# Patient Record
Sex: Male | Born: 1970 | Race: White | Hispanic: No | Marital: Single | Smoking: Current every day smoker
Health system: Southern US, Community
[De-identification: ages and names within clinical notes are randomized; demographics above are authoritative.]

## PROBLEM LIST (undated history)

## (undated) DIAGNOSIS — E512 Wernicke's encephalopathy: Secondary | ICD-10-CM

## (undated) DIAGNOSIS — M1990 Unspecified osteoarthritis, unspecified site: Secondary | ICD-10-CM

## (undated) DIAGNOSIS — R569 Unspecified convulsions: Secondary | ICD-10-CM

## (undated) DIAGNOSIS — G3184 Mild cognitive impairment, so stated: Secondary | ICD-10-CM

## (undated) DIAGNOSIS — F1010 Alcohol abuse, uncomplicated: Secondary | ICD-10-CM

## (undated) HISTORY — DX: Alcohol abuse, uncomplicated: F10.10

## (undated) HISTORY — DX: Unspecified convulsions: R56.9

## (undated) HISTORY — DX: Unspecified osteoarthritis, unspecified site: M19.90

## (undated) HISTORY — DX: Wernicke's encephalopathy: E51.2

## (undated) HISTORY — DX: Mild cognitive impairment of uncertain or unknown etiology: G31.84

---

## 2016-11-17 ENCOUNTER — Emergency Department: Payer: Medicaid Other

## 2016-11-17 ENCOUNTER — Emergency Department
Admission: EM | Admit: 2016-11-17 | Discharge: 2016-11-18 | Disposition: A | Discharge status: Home / Self Care | Payer: Medicaid Other | Attending: Emergency Medicine | Admitting: Emergency Medicine

## 2016-11-17 DIAGNOSIS — M25561 Pain in right knee: Secondary | ICD-10-CM | POA: Diagnosis present

## 2016-11-17 DIAGNOSIS — R000 Tachycardia, unspecified: Secondary | ICD-10-CM | POA: Diagnosis not present

## 2016-11-17 DIAGNOSIS — R002 Palpitations: Secondary | ICD-10-CM

## 2016-11-17 LAB — CBC WITH DIFFERENTIAL/PLATELET
Basophils Absolute: 0.1 10*3/uL (ref 0–0.1)
Basophils Relative: 1 %
Eosinophils Absolute: 0 10*3/uL (ref 0–0.7)
Eosinophils Relative: 1 %
HCT: 43.2 % (ref 40.0–52.0)
Hemoglobin: 15.2 g/dL (ref 13.0–18.0)
Lymphocytes Relative: 17 %
Lymphs Abs: 1.3 10*3/uL (ref 1.0–3.6)
MCH: 31.7 pg (ref 26.0–34.0)
MCHC: 35.1 g/dL (ref 32.0–36.0)
MCV: 90.2 fL (ref 80.0–100.0)
Monocytes Absolute: 0.9 10*3/uL (ref 0.2–1.0)
Monocytes Relative: 11 %
Neutro Abs: 5.8 10*3/uL (ref 1.4–6.5)
Neutrophils Relative %: 72 %
Platelets: 329 10*3/uL (ref 150–440)
RBC: 4.79 MIL/uL (ref 4.40–5.90)
RDW: 14.1 % (ref 11.5–14.5)
WBC: 8.1 10*3/uL (ref 3.8–10.6)

## 2016-11-17 LAB — COMPREHENSIVE METABOLIC PANEL
ALT: 16 U/L — ABNORMAL LOW (ref 17–63)
AST: 20 U/L (ref 15–41)
Albumin: 3.9 g/dL (ref 3.5–5.0)
Alkaline Phosphatase: 87 U/L (ref 38–126)
Anion gap: 9 (ref 5–15)
BUN: 13 mg/dL (ref 6–20)
CO2: 26 mmol/L (ref 22–32)
Calcium: 9.3 mg/dL (ref 8.9–10.3)
Chloride: 105 mmol/L (ref 101–111)
Creatinine, Ser: 1.09 mg/dL (ref 0.61–1.24)
GFR calc Af Amer: >60 mL/min (ref >60)
GFR calc non Af Amer: >60 mL/min (ref >60)
Glucose, Bld: 90 mg/dL (ref 65–99)
Potassium: 3.6 mmol/L (ref 3.5–5.1)
Sodium: 140 mmol/L (ref 135–145)
Total Bilirubin: 0.5 mg/dL (ref 0.3–1.2)
Total Protein: 7 g/dL (ref 6.5–8.1)

## 2016-11-17 LAB — TROPONIN I: Troponin I: <0.03 ng/mL (ref <0.03)

## 2016-11-17 NOTE — ED Provider Notes (Signed)
[facility] Emergency Department Provider Note  ____________________________________________  Time seen: Approximately 10:56 PM  I have reviewed the triage vital signs and the nursing notes.   HISTORY  Chief Complaint Knee Pain    HPI Anthony Romero is a 46 y.o. male presenting to the emergency department via EMS with the chief complaint of 1 out of 10 right knee pain. Additionally, patient states that he is very concerned about tachycardia that he has recorded daily for the past week. Patient reports that he typically has sinus bradycardia at baseline. Patient reports that his resting heart rate has been 120 throughout the week and he has associated chest tightness. Patient denies chest pain, pain in the upper extremities, nausea, vomiting, abdominal pain or excessive diaphoresis. Patient states that he walks with a cane and has an undiagnosed "neurological condition". No alleviating measures have been attempted. Patient denies cough, congestion, excessive exertion and fever.   No past medical history on file.  There are no active problems to display for this patient.   No past surgical history on file.  Prior to Admission medications   Not on File    Allergies Keflex [cephalexin]  No family history on file.  Social History Social History  Substance Use Topics  . Smoking status: Not on file  . Smokeless tobacco: Not on file  . Alcohol use Not on file     Review of Systems  Constitutional: No fever/chills Eyes: No visual changes. No discharge ENT: No upper respiratory complaints. Cardiovascular: Patient has chest tightness. Respiratory: no cough. No SOB. Gastrointestinal: No abdominal pain.  No nausea, no vomiting.  No diarrhea.  No constipation. Genitourinary: Negative for dysuria. No hematuria Musculoskeletal: Patient has right knee pain.  Skin: Negative for rash, abrasions, lacerations, ecchymosis. Neurological: Negative for headaches,  focal weakness or numbness.   ____________________________________________   PHYSICAL EXAM:  VITAL SIGNS: ED Triage Vitals  Enc Vitals Group     BP 11/17/16 2124 120/73     Pulse Rate 11/17/16 2124 (!) 106     Resp 11/17/16 2124 18     Temp 11/17/16 2124 98.5 F (36.9 C)     Temp Source 11/17/16 2124 Oral     SpO2 11/17/16 2124 98 %     Weight --      Height --      Head Circumference --      Peak Flow --      Pain Score 11/17/16 2127 1     Pain Loc --      Pain Edu? --      Excl. in GC? --      Constitutional: Alert and oriented. Well appearing and in no acute distress. Eyes: Conjunctivae are normal. PERRL. EOMI. Head: Atraumatic. ENT:      Nose: No congestion/rhinnorhea.      Mouth/Throat: Mucous membranes are moist.  Neck: No stridor. No cervical spine tenderness to palpation. Cardiovascular: Normal rate, regular rhythm. Normal S1 and S2.  Good peripheral circulation. Respiratory: Normal respiratory effort without tachypnea or retractions. Lungs CTAB. Good air entry to the bases with no decreased or absent breath sounds. Gastrointestinal: Bowel sounds 4 quadrants. Soft and nontender to palpation. No guarding or rigidity. No palpable masses. No distention. No CVA tenderness. Musculoskeletal: Patient has 5/5 strength in the lower extremities bilaterally. Right knee: Peripatellar dimpling visualized. Negative anterior and posterior drawer test. No laxity with MCL or LCL testing. Negative ballottement. Patient has pain with palpation over the pes anserene bursa. Palpable  dorsalis pedis pulse bilaterally and symmetrically Neurologic:  Normal speech and language. No gross focal neurologic deficits are appreciated.  Skin:  Skin is warm, dry and intact. No rash noted. Psychiatric: Mood and affect are normal. Speech and behavior are normal. Patient exhibits appropriate insight and judgement.   ____________________________________________   LABS (all labs ordered are listed,  but only abnormal results are displayed)  Labs Reviewed  TROPONIN I  CBC WITH DIFFERENTIAL/PLATELET  COMPREHENSIVE METABOLIC PANEL   ____________________________________________  EKG   ____________________________________________  RADIOLOGY  Geraldo Pitter, personally viewed and evaluated these images (plain radiographs) as part of my medical decision making, as well as reviewing the written report by the radiologist.  Dg Knee Complete 4 Views Right  Result Date: 11/17/2016 CLINICAL DATA:  Right knee pain for several days EXAM: RIGHT KNEE - COMPLETE 4+ VIEW COMPARISON:  None. FINDINGS: Minimal patellofemoral degenerative change. No large effusion. No acute displaced fracture or malalignment. The joint space compartments are maintained IMPRESSION: No acute osseous abnormality. Electronically Signed   By: Jasmine Pang M.D.   On: 11/17/2016 22:15    ____________________________________________    PROCEDURES  Procedure(s) performed:    Procedures    Medications - No data to display   ____________________________________________   INITIAL IMPRESSION / ASSESSMENT AND PLAN / ED COURSE  Pertinent labs & imaging results that were available during my care of the patient were reviewed by me and considered in my medical decision making (see chart for details).  Review of the [place] CSRS was performed in accordance of the [facility] prior to dispensing any controlled drugs.     Assessment and plan: Chest tightness: Tachycardia: Right knee pain: Patient reports right knee pain. History and physical exam findings are consistent with pes anserine bursitis. Patient will be discharged with Mobic to be used daily for pain and inflammation. Patient also reports tachycardia and chest tightness. Patient was moved to main side of the emergency department for further care and management. Patient's case was discussed with Dr. Lenard Lance, who assumed patient care. All patient questions were  answered.   ____________________________________________  FINAL CLINICAL IMPRESSION(S) / ED DIAGNOSES  Final diagnoses:  Acute pain of right knee      NEW MEDICATIONS STARTED DURING THIS VISIT:  New Prescriptions   No medications on file        This chart was dictated using voice recognition software/Dragon. Despite best efforts to proofread, errors can occur which can change the meaning. Any change was purely unintentional.    Gasper Lloyd 11/17/16 2319    Sharman Cheek, MD 11/18/16 0120

## 2016-11-17 NOTE — ED Notes (Signed)
Patient to stat desk by EMS via wheelchair for right knee pain for "couple days".  Denies any type of injury.

## 2016-11-17 NOTE — ED Triage Notes (Signed)
Patient reports right knee pain for several days, denies any type of injury.  Reports feels like it is going to give way.

## 2016-11-17 NOTE — ED Provider Notes (Signed)
EKG interpreted by me Normal sinus rhythm rate of 76, normal axis and intervals. Normal QRS and ST segments. Isolated T-wave inversion in lead 3 which is nonspecific   Sharman CheekStafford, Libra Gatz, MD 11/17/16 2314

## 2016-11-17 NOTE — ED Notes (Signed)
Patient c/o tachycardia and chest tightness X 4 days. Pt reports associated symptoms of dizziness, lightheadedness, nausea, weakness and SOB. Pt denies diaphoresis. Pt reports he has chronic back issues, unsure if pain is different from his norm.

## 2016-11-18 LAB — T4, FREE: Free T4: 0.93 ng/dL (ref 0.61–1.12)

## 2016-11-18 LAB — TROPONIN I: Troponin I: <0.03 ng/mL (ref <0.03)

## 2016-11-18 LAB — TSH: TSH: 1.895 u[IU]/mL (ref 0.350–4.500)

## 2016-11-18 LAB — FIBRIN DERIVATIVES D-DIMER (ARMC ONLY): Fibrin derivatives D-dimer (ARMC): 279.44 (ref 0.00–499.00)

## 2016-11-18 MED ORDER — ASPIRIN 81 MG PO CHEW
324.0000 mg | CHEWABLE_TABLET | Freq: Once | ORAL | Status: AC
  Administered 2016-11-18: 324 mg via ORAL
  Filled 2016-11-18: qty 4

## 2016-11-18 MED ORDER — SODIUM CHLORIDE 0.9 % IV BOLUS (SEPSIS)
1000.0000 mL | Freq: Once | INTRAVENOUS | Status: AC
  Administered 2016-11-18: 1000 mL via INTRAVENOUS

## 2016-11-18 NOTE — ED Provider Notes (Addendum)
Medical screening examination/treatment/procedure(s) were conducted as a shared visit with non-physician practitioner(s) and myself.  I personally evaluated the patient during the encounter.   46 year old male with no significant past medical history who presented with complaints of knee pain for 2 days and also palpitations. Patient reports 6 days of heart rate in the 120s associated with mild chest discomfort. The symptoms have been constant over the course of the last 6 days. He denies URI symptoms, fever. He is not a smoker. No personal or family history of ischemic heart disease or blood clots, no recent travel or immobilization, no leg pain or swelling, no hemoptysis. Patient denies any history of thyroid disease. Denies any syncopal episodes. Patient also endorses 2 days of right knee pain and points to the medial aspect of his knee. Patient has had chronic pains with that knee in the past.  On exam patient is extremely well appearing, in no distress, vital signs within normal limits, lungs are clear to auscultation, heart regular rate and rhythm with no murmurs, abdomen is soft and nontender, there is no pitting edema of his lower extremities. Patient has mild tenderness to palpation on the medial aspect of his knee with no deformities, swelling, erythema or warmth.  EKG showing sinus rhythm with a rate of 76 and no evidence of ischemia or arrhythmia.  Labs showing normal CBC, normal CMP, troponin 2 negative, d-dimer negative. CXR negative. XR of the knee negative. Patient monitor on telemetry for 5 hours with no evidence of arrhythmia.  Patient received IV fluids and feels markedly improved. Patient will be discharged home with referral to cardiology for possible holter monitoring.   Don PerkingVeronese, [place], MD 11/18/16 [id number]      Nita SickleVeronese, [place], MD 11/18/16 [phone]

## 2017-04-03 ENCOUNTER — Ambulatory Visit: Payer: Medicaid Other | Attending: Physician Assistant

## 2017-04-03 VITALS — BP 92/66 | HR 78

## 2017-04-03 DIAGNOSIS — R262 Difficulty in walking, not elsewhere classified: Secondary | ICD-10-CM | POA: Diagnosis present

## 2017-04-03 DIAGNOSIS — M6281 Muscle weakness (generalized): Secondary | ICD-10-CM | POA: Diagnosis not present

## 2017-04-03 NOTE — Therapy (Signed)
[place] [facility] PHYSICAL AND SPORTS MEDICINE [address] [place], [address] Phone: [phone]   Fax:  [phone]  Physical Therapy Evaluation  Patient Details  Name: Anthony Romero MRN: [id number] Date of Birth: 03-22-1971 Referring Provider: Leward Quan PA   Encounter Date: 04/03/2017  PT End of Session - 04/03/17 0958    Visit Number  1    Number of Visits  4 Eval + 3 visits in 1st month    Date for PT Re-Evaluation  05/01/17    Authorization Type  1    Authorization Time Period  of 4 Medicaid (eval + 3 visits)    PT Start Time  [phone]    PT Stop Time  1059    PT Time Calculation (min)  61 min    Equipment Utilized During Treatment  Gait belt;Other (comment) Pt bilateral axillary crutches    Activity Tolerance  Patient tolerated treatment well    Behavior During Therapy  WFL for tasks assessed/performed       Past Medical History:  Diagnosis Date  . Alcohol abuse    [facility] pt medical [facility]x [facility]  . Arthritis    from pt medical hx [facility]  . Mild cognitive impairment    from pt medical hx [facility]  . Seizures (HCC)    [facility] medical [facility]x [facility]  . Wernicke encephalopathy    [facility] medical hx [facility]    No past surgical history on file.  Vitals:   04/03/17 1022  BP: 92/66  Pulse: 78     Subjective Assessment - 04/03/17 1010    Subjective  No pain level provided. "Nothing substantial."    Pertinent History  Deconditioning, and osteoarthritis. Pt states having degenerative arthritis. Was in the army for 3 years about 21 years ago.  Feels pain from the top of his head to the bottom of his feet.  His main problem since getting out of the hospital is deconditioning. Was in his bed for about 8 months. Pt thinks he went to the hospital December 20, 2015.   Knee pain is not substantial.  Has pain along his spine which is worse. Joints such as back, hips, knuckles bother him. Does not really want pain medication. Mostly antiinflamatories.   As far as pain level goes, pt states that his pain is nothing substantial.  Last sezure was over a year ago.  Pt state that he has a sense of getting a sezure but can't describe it. Sezure might happen 30 min to an hour afterwards.  Pt wants the ambulance called if he has a sezure.  caregiver Marchelle Folks present [facility])    Patient Stated Goals  Pt states wanting to go back to [place], Pt states feeling weak.     Currently in Pain?  Other (Comment)    Pain Score  -- "nothing substantial"    Pain Onset  More than a month ago    Aggravating Factors   muscular pain when getting up out of bed         [facility] PT Assessment - 04/03/17 1019      Assessment   Medical Diagnosis  Deconditioning, osteoarthritis    Referring Provider  Leward Quan PA    Onset Date/Surgical Date  12/18/16 date PT referral signed    Prior Therapy  No known PT for  current condition      Precautions   Precaution Comments  fall risk      Restrictions   Other Position/Activity Restrictions  No known restrictions      Balance Screen   Has the patient fallen in the past 6 months  No    Has the patient had a decrease in activity level because of a fear of falling?   Yes    Is the patient reluctant to leave their home because of a fear of falling?   Yes      Home Environment   Additional Comments  Lives in a group home [facility] 1 story home, no stairs, has a ramp, bilateral rails       Prior Function   Vocation  Unemployed    Vocation Requirements  PLOF: able to ambulate without AD    Leisure  nothing      Observation/Other Assessments   Observations  TUG without AD: 16.76 seconds, 15 seconds (15.88 seconds average) unsteady    Lower Extremity Functional Scale   48/80      Posture/Postural Control   Posture  Comments  Bilaterally protracted shoulders      Strength   Right Hip Flexion  4/5    Right Hip Extension  3+/5    Right Hip ABduction  4-/5    Left Hip Flexion  4/5    Left Hip Extension  3+/5    Left Hip ABduction  4-/5    Right Knee Flexion  4+/5    Right Knee Extension  4+/5    Left Knee Flexion  4/5    Left Knee Extension  4+/5      Ambulation/Gait   Gait Comments  bilateral axillary crutch used normally. without crutches: pelvic drop during stance phase of gait, unsteady, decreased knee flexion during swing phase, decreased trunk control              Objective measurements completed on examination: See above findings.   There-ex  S/L hip abduction 10x2 each side  bridge 10x3  reviewed HEP. Handout provided. Pt and caregiver Marchelle Folks(Amanda) demonstrated and verbvalized understanding.    Improved exercise technique, movement at target joints, use of target muscles after min to mod verbal, visual, tactile cues.   reviewed plan of care: 3 visits per month based on Medicaid insurance. 1 visit per week.         PT Education - 04/03/17 1117    Education provided  Yes    Education Details  ther-ex, HEP, plan of care    Person(s) Educated  Patient;Caregiver(s) Marchelle FolksAmanda    Methods  Explanation;Demonstration;Tactile cues;Verbal cues;Handout    Comprehension  Verbalized understanding;Returned demonstration          PT Long Term Goals - 04/03/17 1129      PT LONG TERM GOAL #1   Title  Patient and caregiver will be independent with his HEP to promote strength and ability to ambulate and perform standing tasks.     Baseline  Pt has started a limited home exercise program.     Time  4    Period  Weeks    Status  New    Target Date  05/01/17      PT LONG TERM GOAL #2   Title  Patient will improve bilateral LE strength by at least 1/2 MMT grade to promote ability to ambulate and perform standing tasks.     Baseline  Hip flexion 4/5 bilaterally, hip extension 3+/5  bilaterally, hip abduction 4-/5 bilaterally (04/03/2017)    Time  4    Period  Weeks    Status  New    Target Date  05/01/17      PT LONG TERM GOAL #3   Title  Pt will improve his LEFS score by at least 9 points as a demonstration of improved function.     Baseline  48/80 (04/03/2017)    Time  4    Period  Weeks    Status  New    Target Date  05/01/17             Plan - 04/03/17 1121    Clinical Impression Statement  Patient is a [age] year old male who came to physical therapy secondary to osteoarthritis and deconditioning. He also presents with altered gait pattern, bilateral LE weakness, decreased trunk control, fall risk, and difficulty performing functional tasks such as walking and performing standing tasks. Patient will benefit from skilled physical therapy services to address the aforementioned deficits.     History and Personal Factors relevant to plan of care:  weakness, decreased balance, lives in a home health facility    Clinical Presentation  Stable    Clinical Presentation due to:  Pt states symptoms are the same since onset per medical screening form    Clinical Decision Making  Low    Rehab Potential  Fair    Clinical Impairments Affecting Rehab Potential  Chronicity of condition, patient level of motivation, weakness    PT Frequency  1x / week    PT Duration  4 weeks    PT Treatment/Interventions  Neuromuscular re-education;Balance training;Therapeutic exercise;Therapeutic activities;Aquatic Therapy;Ultrasound;Gait training;Stair training;Patient/family education;Manual techniques;Dry needling    PT Next Visit Plan  strengthening, gait, function    Consulted and Agree with Plan of Care  Patient;Family member/caregiver    Family Member Consulted  care[facility]       Patient will benefit from skilled therapeutic intervention in order to improve the following deficits and impairments:  Postural dysfunction, Improper body  mechanics, Difficulty walking, Decreased strength, Decreased endurance, Decreased balance  Visit Diagnosis: Muscle weakness (generalized) - Plan: PT plan of care cert/re-cert  Difficulty in walking, not elsewhere classified - Plan: PT plan of care cert/re-cert     Problem List There are no active problems to display for this patient.   Loralyn FreshwaterMiguel Camila [place] PT, DPT   04/03/2017, 6:34 PM  [place] [facility] [address] [place], [place], [id number] Phone: [phone]   Fax:  [phone]  Name: Anthony Romero MRN: [id number] Date of Birth: [birth year]

## 2017-04-03 NOTE — Patient Instructions (Signed)
Abduction: Side Leg Lift (Eccentric) - Side-Lying    Lie on side. Lift top leg slightly higher than shoulder level. Keep top leg straight with body, toes pointing forward.  __10_ reps per set, __2_ sets per day,   http://ecce.exer.us/62   Copyright  VHI. All rights reserved.       Bridge   Lie on back, legs bent. Lift hips up. Repeat __10__ times. Do __3__ sessions per day.  Copyright  VHI. All rights reserved.

## 2017-04-09 ENCOUNTER — Ambulatory Visit: Payer: Medicaid Other

## 2017-04-09 DIAGNOSIS — R262 Difficulty in walking, not elsewhere classified: Secondary | ICD-10-CM

## 2017-04-09 DIAGNOSIS — M6281 Muscle weakness (generalized): Secondary | ICD-10-CM | POA: Diagnosis not present

## 2017-04-09 NOTE — Therapy (Signed)
[facility]ICINE [address] [place], [address] Phone: [phone]   Fax:  [phone]  Physical Therapy Treatment  Patient Details  Name: Anthony Romero MRN: [id number] Date of Birth: 1970-12-03 Referring Provider: Leward Quan PA   Encounter Date: 04/09/2017  PT End of Session - 04/09/17 1036    Visit Number  2    Number of Visits  4 Eval + 3 visits in 1st month    Date for PT Re-Evaluation  05/01/17    Authorization Type  2    Authorization Time Period  of 4 Medicaid (eval + 3 visits)    PT Start Time  1038    PT Stop Time  1121    PT Time Calculation (min)  43 min    Equipment Utilized During Treatment  Gait belt;Other (comment) Pt bilateral axillary crutches    Activity Tolerance  Patient tolerated treatment well    Behavior During Therapy  WFL for tasks assessed/performed       Past Medical History:  Diagnosis Date  . Alcohol abuse    [facility]x [facility]  . Arthritis    [facility] medical hx [facility]  . Mild cognitive impairment    [facility] medical [facility]x [facility]  . Seizures (HCC)    from [facility] medical hx [facility]  . Wernicke encephalopathy    [facility]x [facility]    History reviewed. No pertinent surgical history.  There were no vitals filed for this visit.  Subjective Assessment - 04/09/17 1041    Subjective  Strength seems ok. The HEP is ok. Pain is nothing really worth mentioning. Pt states he has weakness but also has difficulty with controling movements neurologically.  Has seen a neurologist at the hospital and one time afterwards but they don't do much.     Pertinent History  Deconditioning, and osteoarthritis. Pt states having degenerative arthritis. Was in the army for 3 years about 21 years ago.  Feels pain from the top of his head to  the bottom of his feet.  His main problem since getting out of the hospital is deconditioning. Was in his bed for about 8 months. Pt thinks he went to the hospital December 20, 2015.   Knee pain is not substantial. Has pain along his spine which is worse. Joints such as back, hips, knuckles bother him. Does not really want pain medication. Mostly antiinflamatories.   As far as pain level goes, pt states that his pain is nothing substantial.  Last sezure was over a year ago.  Pt state that he has a sense of getting a sezure but can't describe it. Sezure might happen 30 min to an hour afterwards.  Pt wants the ambulance called if he has a sezure.  caregiver Marchelle Folks present [facility])    Patient Stated Goals  Pt states wanting to go back to [place], Pt states feeling weak.     Currently in Pain?  No/denies    Pain Score  -- no pain level provided    Pain Onset  More than a month ago                              PT Education - 04/09/17 1916    Education provided  Yes    Education Details  ther-ex, HEP    Person(s) Educated  Patient    Methods  Explanation;Demonstration;Tactile cues;Verbal cues;Handout    Comprehension  Verbalized understanding;Returned demonstration         Objective   There-ex  Side step at treadmill bars 6x each side  Mini squats with bilateral UE assist 10x2  Seated    LE PNF D2 flexion      R LE 5x 2. R hip discomfort which eases with rest.       L LE 5x 2  Stand to sit without UE assist, emphasis on femoral control 5x, 10x2  Standing mini lunge to the side with bilateral UE assist 10x each side  Static standing balance, feet together, no UE assist 30 seconds x 3  Seated glute max squeeze 10x10 seconds   Seated hip adductor and glute max squeeze 10x2  Seated bilateral shoulder extension resisting yellow band 10x2   Improved exercise technique, movement at target joints, use of target muscles after min  to mod verbal, visual, tactile cues.   Worked on LE strengthening, and control to promote ability to ambulate and perform standing tasks. Difficulty with LE control observed when performing standing tasks, especially when fatigued.         PT Long Term Goals - 04/03/17 1129      PT LONG TERM GOAL #1   Title  Patient and caregiver will be independent with his HEP to promote strength and ability to ambulate and perform standing tasks.     Baseline  Pt has started a limited home exercise program.     Time  4    Period  Weeks    Status  New    Target Date  05/01/17      PT LONG TERM GOAL #2   Title  Patient will improve bilateral LE strength by at least 1/2 MMT grade to promote ability to ambulate and perform standing tasks.     Baseline  Hip flexion 4/5 bilaterally, hip extension 3+/5 bilaterally, hip abduction 4-/5 bilaterally (04/03/2017)    Time  4    Period  Weeks    Status  New    Target Date  05/01/17      PT LONG TERM GOAL #3   Title  Pt will improve his LEFS score by at least 9 points as a demonstration of improved function.     Baseline  48/80 (04/03/2017)    Time  4    Period  Weeks    Status  New    Target Date  05/01/17            Plan - 04/09/17 1916    Clinical Impression Statement  Worked on LE strengthening, and control to promote ability to ambulate and perform standing tasks. Difficulty with LE control observed when performing standing tasks, especially when fatigued.     History and Personal Factors relevant to plan of care:   weakness, decreased balance, lives in a home health facility     Clinical Presentation  Stable    Clinical Presentation due to:  Pt tolerated session well without pain aggravation    Clinical Decision Making  Low    Rehab Potential  Fair    Clinical Impairments Affecting Rehab Potential  Chronicity of condition, patient level of motivation, weakness    PT Frequency  1x / week    PT Duration  4 weeks    PT Treatment/Interventions   Neuromuscular re-education;Balance training;Therapeutic  exercise;Therapeutic activities;Aquatic Therapy;Ultrasound;Gait training;Stair training;Patient/family education;Manual techniques;Dry needling    PT Next Visit Plan  strengthening, gait, function    Consulted and Agree with Plan of Care  Patient;Family member/caregiver    Family Member Consulted  caregiver Marchelle [facility]manda [facility]       Patient will benefit from skilled therapeutic intervention in order to improve the following deficits and impairments:  Postural dysfunction, Improper body mechanics, Difficulty walking, Decreased strength, Decreased endurance, Decreased balance  Visit Diagnosis: Muscle weakness (generalized)  Difficulty in walking, not elsewhere classified     Problem List There are no active problems to display for this patient.   Loralyn FreshwaterMiguel Jaasia Viglione PT, DPT   04/09/2017, 7:23 PM  [facility] [address] [place], [place], [id number] Phone: [phone]   Fax:  [phone]  Name: Joesph FillersCharles Steinhauser MRN: [id number] Date of Birth: 07-19-1970

## 2017-04-09 NOTE — Patient Instructions (Addendum)
  Sit to stand to sit   With someone with your for balance safety   Stand up from your chair using your hands   Sit down without using your hands, keeping your body weight between your feet.    Keep your feet and knees in neutral position   Repeat 10 times   Perform 3 sets daily.

## 2017-04-22 ENCOUNTER — Ambulatory Visit: Payer: Medicaid Other

## 2017-04-29 ENCOUNTER — Ambulatory Visit: Payer: Medicaid Other | Attending: Physician Assistant

## 2017-04-29 DIAGNOSIS — M6281 Muscle weakness (generalized): Secondary | ICD-10-CM | POA: Diagnosis not present

## 2017-04-29 DIAGNOSIS — R262 Difficulty in walking, not elsewhere classified: Secondary | ICD-10-CM | POA: Diagnosis present

## 2017-04-29 NOTE — Patient Instructions (Addendum)
  Adduction: Hip - Knees Together (Sitting)   Sit with 2 folded pillows between knees. Squeeze rear end muscles together. Push knees together. Hold for _5__ seconds. Repeat _10__ times. Do __3_ times a day.  Copyright  VHI. All rights reserved.    Strengthening: Resisted Extension    SITTING on your chair, red band attached to the door knob and the door is securely shut.   Hold band, one in each hand, arms forward. Pull arms back, elbow straight. Hold for 5 seconds Repeat ___10_ times per set. Do _3___ sets per session. Do _1__ sessions per day.  http://orth.exer.us/832   Copyright  VHI. All rights reserved.      Heel Raises    Stand with support.  With knees straight, raise heels off ground.  Repeat __10_ times. Do __3_ times a day.  Copyright  VHI. All rights reserved.

## 2017-04-29 NOTE — Therapy (Signed)
[facility]ORTS [facility]IC[facility] [address] [place], [address] Phone: [phone]   Fax:  [phone]  Physical Therapy Treatment And Progress Report  Patient Details  Name: Anthony Romero MRN: [id number] Date of Birth: 04-26-1971 Referring Provider: Elvera Bicker PA   Encounter Date: 04/29/2017  PT End of Session - 04/29/17 1345    Visit Number  3    Number of Visits  7    Date for PT Re-Evaluation  05/29/17    Authorization Type  3    Authorization Time Period  of 4 Medicaid (eval + 3 visits)    PT Start Time  0277    PT Stop Time  1432    PT Time Calculation (min)  47 min    Equipment Utilized During Treatment  Gait belt;Other (comment) Pt bilateral axillary crutches    Activity Tolerance  Patient tolerated treatment well    Behavior During Therapy  WFL for tasks assessed/performed       Past Medical History:  Diagnosis Date  . Alcohol abuse    from pt medical hx [place]  . Arthritis    from pt medical hx [place]  . Mild cognitive impairment    from pt medical hx [place]  . Seizures ([place])    from pt medical hx [place]  . Wernicke encephalopathy    from pt medical hx [place]    History reviewed. No pertinent surgical history.  There were no vitals filed for this visit.  Subjective Assessment - 04/29/17 1347    Subjective  Nothing unsual (in terms of pain). Strength is ok. Has been trying to do his HEP everyday. Wants go get out of his current home situation and back to independent living.     Pertinent History  Deconditioning, and osteoarthritis. Pt states having degenerative arthritis. Was in the army for 3 years about 21 years ago.  Feels pain from the top of his head to the bottom of his feet.  His main problem since getting out of the hospital is  deconditioning. Was in his bed for about 8 months. Pt thinks he went to the hospital December 20, 2015.   Knee pain is not substantial. Has pain along his spine which is worse. Joints such as back, hips, knuckles bother him. Does not really want pain medication. Mostly antiinflamatories.   As far as pain level goes, pt states that his pain is nothing substantial.  Last sezure was over a year ago.  Pt state that he has a sense of getting a sezure but can't describe it. Sezure might happen 30 min to an hour afterwards.  Pt wants the ambulance called if he has a sezure.  caregiver Estill Bamberg present ([place])    Patient Stated Goals  Pt states wanting to go back to [place], [place] states feeling weak.     Currently in Pain?  Other (Comment)    Pain Score  -- Did not provided pain information. "Nothing unusual."    Pain Onset  More than a month ago         [facility] PT Assessment - 04/29/17 1349      Observation/Other Assessments   Observations  TUG without AD: 14 seconds, 16 seconds (LOB when turning to the L, min A from PT to recover).  unsteady; 15 seconds average  Lower Extremity Functional Scale   63/80      Strength   Right Hip Flexion  4/5    Right Hip Extension  4/5    Right Hip ABduction  5/5    Left Hip Flexion  4+/5    Left Hip Extension  4/5    Left Hip ABduction  5/5    Right Knee Flexion  5/5    Right Knee Extension  5/5    Left Knee Flexion  5/5    Left Knee Extension  5/5                          PT Education - 04/29/17 1416    Education provided  Yes    Education Details  ther-ex, HEP    Person(s) Educated  Patient    Methods  Explanation;Demonstration;Tactile cues;Verbal cues;Handout    Comprehension  Returned demonstration;Verbalized understanding         Objective   Ther-ex  Seated manually resisted hip flexion, knee flexion, knee extension, S/L hip abduction, prone hip extension 1-2x each way for each LE.   Reviewed progress/current status with LE strength  Standing up from a chair, walking 10 ft forward, then returning 10 ft, then sitting back onto chair 2x    LOB x1 when turnign to the L, min A to recover  Standing 360 degree turns 1x each direction CGA, no UE assist  Pt states usually feeling dizzy when in the standing position due to his low blood pressure. Declined to check blood pressure level.    Seated hip adductor ball/glute max squeeze 10x3 with 5 second holds  Seated bilateral shoulder extension resisting red band 10x5 seconds for 3 sets   Reviewed plan of care: 2 visits this month (pending medicaid approval) per pt request but requesting 3 just in case pt changes his mind.   Standing heel raises 10x3 with bilateral UE assist  Standing alternating toe taps onto treadmill platform without UE assist 10x, then 7x  Dizziness which eases with sitting. Pt states knowing when to ease off.   Seated rows red band 10x5 seconds   Improved exercise technique, movement at target joints, use of target muscles after min to mod verbal, visual, tactile cues.    Pt demonstrates improved overall LE strength and function since initial evaluation. Difficulty with standing activities and gait withut use of AD secondary to difficulty with motor control with bilateral LE, resulting in ataxic gait pattern. LOB when trying to turn without use of AD. Patient still demonstrates difficulty performing functional tasks and walking and will benefit from continued skilled physical therapy services to address the aforementioned deficits.          PT Long Term Goals - 04/29/17 1935      PT LONG TERM GOAL #1   Title  Patient and caregiver will be independent with his HEP to promote strength and ability to ambulate and perform standing tasks.     Baseline  Pt has started a limited home exercise program. Exercises added when appropriate (04/29/2017)    Time  4    Period  Weeks    Status  On-going     Target Date  05/29/17      PT LONG TERM GOAL #2   Title  Patient will improve bilateral LE strength by at least 1/2 MMT grade to promote ability to ambulate and perform standing tasks.     Baseline  Hip flexion 4/5 bilaterally,  hip extension 3+/5 bilaterally, hip abduction 4-/5 bilaterally (04/03/2017); hip flexion 4/5 R, 4+/5 L, hip extension 4/5 bilaterally, hip abduction 5/5 bilaterally, knee flexion 5/5 bilaterally, knee extension 5/5 bilaterally (04/29/2017)    Time  4    Period  Weeks    Status  Partially Met    Target Date  05/29/17      PT LONG TERM GOAL #3   Title  Pt will improve his LEFS score by at least 9 points as a demonstration of improved function.     Baseline  48/80 (04/03/2017); 63/80 (04/29/2017)    Time  4    Period  Weeks    Status  Achieved      PT LONG TERM GOAL #4   Title  Pt will improve TUG time to 12 seconds or less on average without AD and no LOB to promote mobility.     Baseline  15 seconds on average without AD, LOB x 1 when turning to the , min A from PT to recover (04/29/2017)    Time  4    Period  Weeks    Status  New    Target Date  05/29/17            Plan - 04/29/17 1344    Clinical Impression Statement  Pt demonstrates improved overall LE strength and function since initial evaluation. Difficulty with standing activities and gait withut use of AD secondary to difficulty with motor control with bilateral LE, resulting in ataxic gait pattern. LOB when trying to turn without use of AD. Patient still demonstrates difficulty performing functional tasks and walking and will benefit from continued skilled physical therapy services to address the aforementioned deficits.     History and Personal Factors relevant to plan of care:    weakness, decreased balance, lives in a home health facility      Clinical Presentation  Stable    Clinical Presentation due to:  Improved overall strength and function    Clinical Decision Making  Low    Rehab Potential   Fair    Clinical Impairments Affecting Rehab Potential  Chronicity of condition, patient level of motivation, weakness    PT Frequency  1x / week    PT Duration  4 weeks    PT Treatment/Interventions  Neuromuscular re-education;Balance training;Therapeutic exercise;Therapeutic activities;Aquatic Therapy;Ultrasound;Gait training;Stair training;Patient/family education;Manual techniques;Dry needling    PT Next Visit Plan  strengthening, gait, function    Consulted and Agree with Plan of Care  Patient    Family Member Consulted  --       Patient will benefit from skilled therapeutic intervention in order to improve the following deficits and impairments:  Postural dysfunction, Improper body mechanics, Difficulty walking, Decreased strength, Decreased endurance, Decreased balance  Visit Diagnosis: Muscle weakness (generalized) - Plan: PT plan of care cert/re-cert  Difficulty in walking, not elsewhere classified - Plan: PT plan of care cert/re-cert     Problem List There are no active problems to display for this patient.   Joneen Boers PT, DPT   04/29/2017, 7:58 PM  [facility] PHYSICAL AND SPORTS MEDICINE [address] [place], [address] Phone: [phone]   Fax:  [phone]  Name: Anthony Romero MRN: [id number] Date of Birth: 27-Dec-1970

## 2017-05-13 ENCOUNTER — Ambulatory Visit: Payer: Medicaid Other

## 2017-05-15 ENCOUNTER — Ambulatory Visit: Payer: Medicaid Other

## 2018-04-19 IMAGING — CR DG CHEST 2V
1 series · 2 of 2 positions shown · non-contrast
Comparison: None.

CLINICAL DATA: Tachycardia, chest tightness, dizziness,
lightheadedness, nausea, weakness and shortness of breath for 4
days.

EXAM:
CHEST  2 VIEW

[Series 1: dg chest 2 view · 0.14mm/px · 2 of 2 slices shown]
[im 1/2]
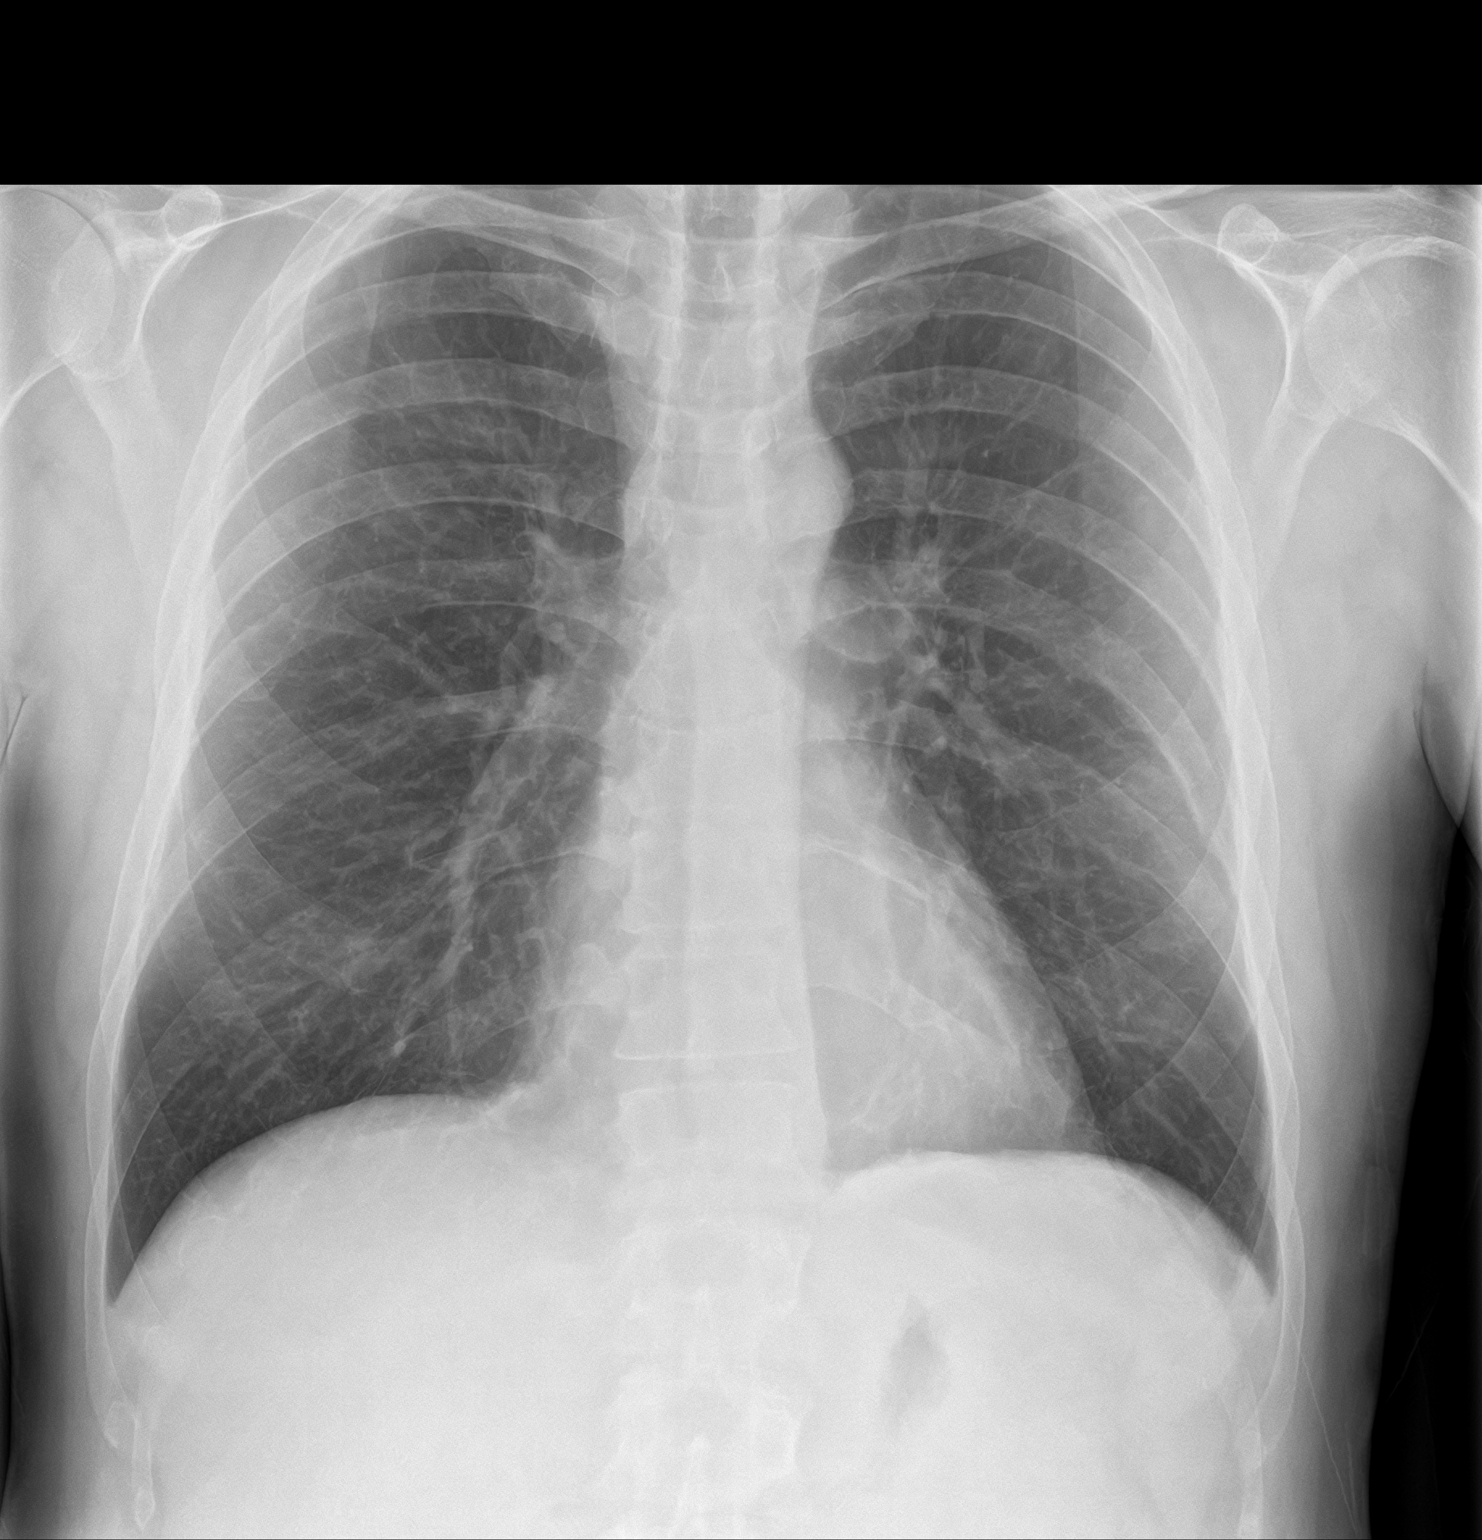
[im 2/2]
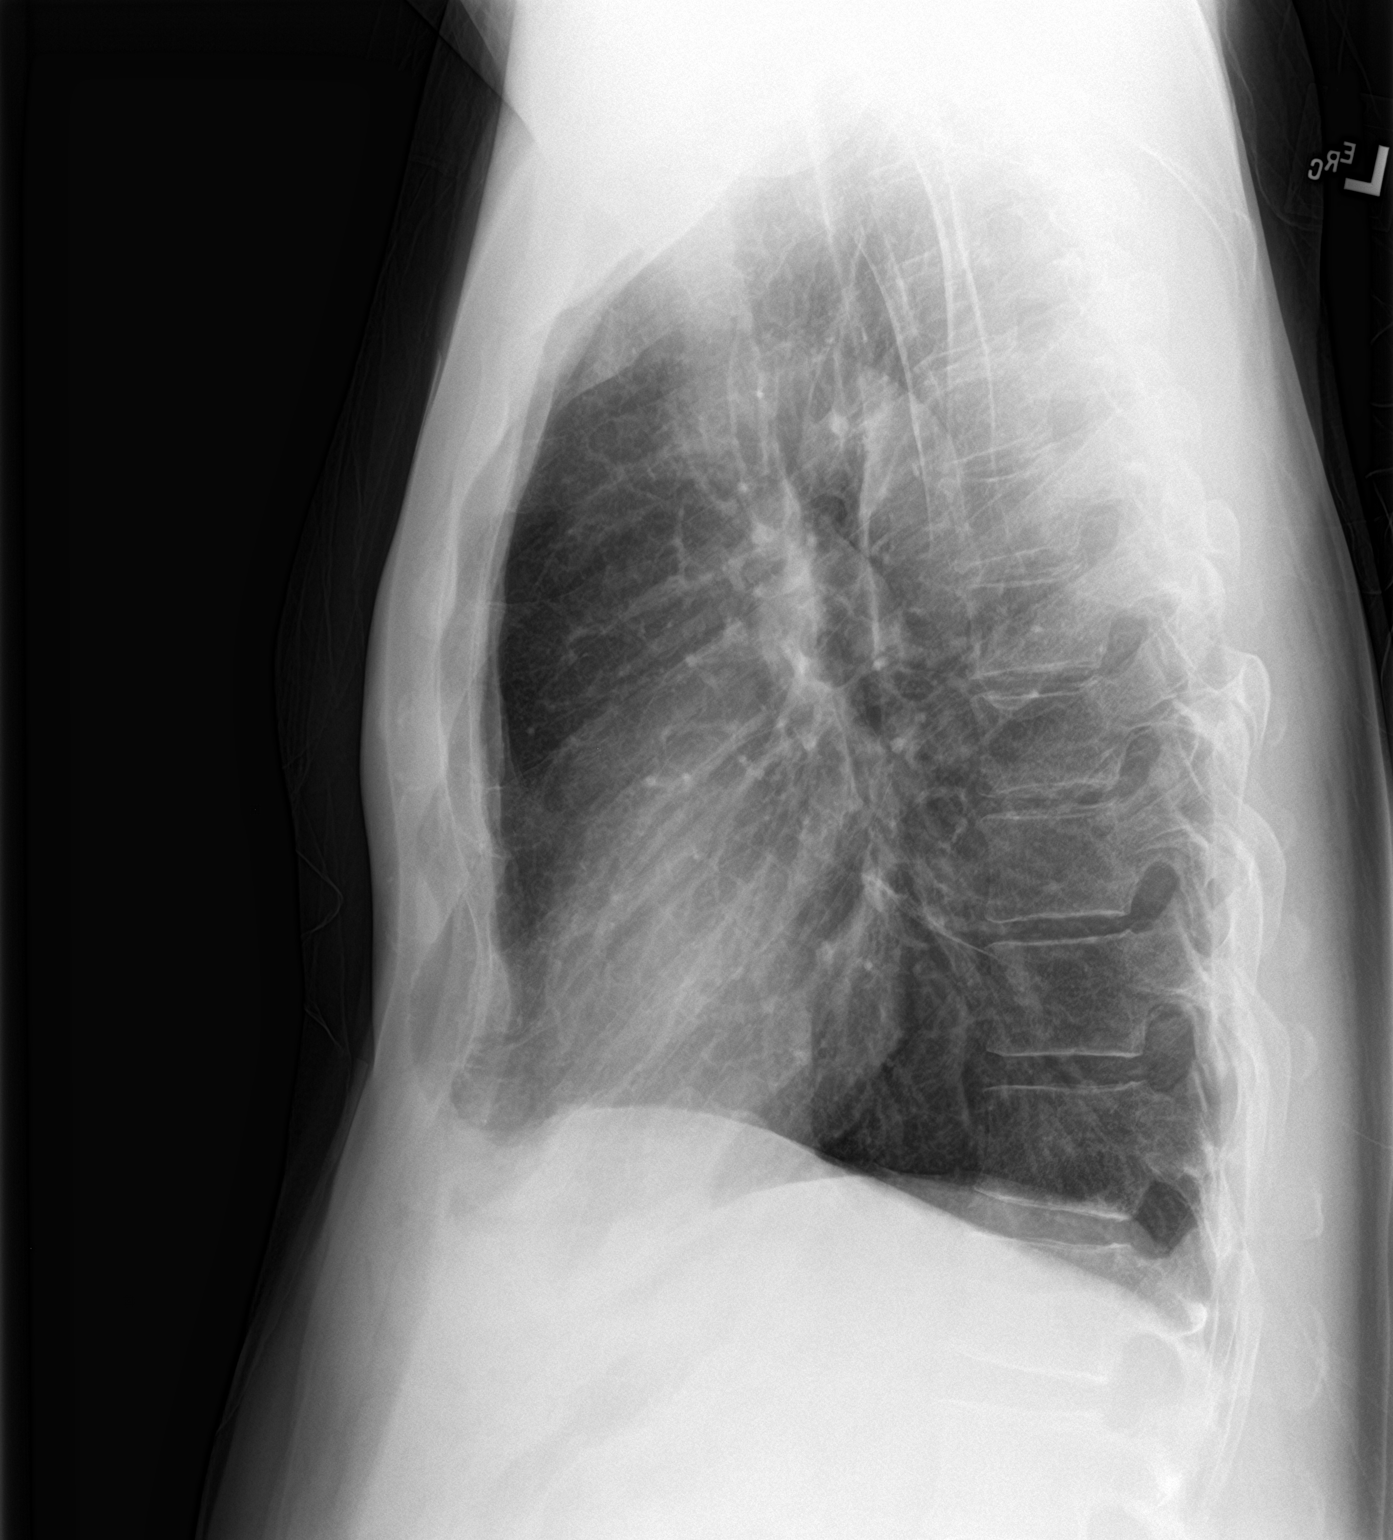

[2 of 2 positions shown; findings below may reference images not displayed]

FINDINGS: Cardiomediastinal silhouette is normal. No pleural effusions or
focal consolidations. Hyperinflation with mildly flattened
hemidiaphragms. Trachea projects midline and there is no
pneumothorax. Soft tissue planes and included osseous structures are
non-suspicious.
IMPRESSION: Hyperinflation without focal consolidation.
